# Patient Record
Sex: Male | Born: 1955 | Race: Black or African American | Hispanic: No | Marital: Married
Health system: Southern US, Community
[De-identification: ages and names within clinical notes are randomized; demographics above are authoritative.]

---

## 2024-01-11 ENCOUNTER — Emergency Department (HOSPITAL_COMMUNITY)

## 2024-01-11 ENCOUNTER — Observation Stay (HOSPITAL_BASED_OUTPATIENT_CLINIC_OR_DEPARTMENT_OTHER)

## 2024-01-11 ENCOUNTER — Other Ambulatory Visit: Payer: Self-pay

## 2024-01-11 DIAGNOSIS — I5032 Chronic diastolic (congestive) heart failure: Secondary | ICD-10-CM | POA: Diagnosis present

## 2024-01-11 DIAGNOSIS — N183 Chronic kidney disease, stage 3 unspecified: Secondary | ICD-10-CM | POA: Diagnosis present

## 2024-01-11 DIAGNOSIS — I6529 Occlusion and stenosis of unspecified carotid artery: Secondary | ICD-10-CM | POA: Diagnosis present

## 2024-01-11 DIAGNOSIS — R55 Syncope and collapse: Secondary | ICD-10-CM

## 2024-01-11 DIAGNOSIS — N400 Enlarged prostate without lower urinary tract symptoms: Secondary | ICD-10-CM | POA: Diagnosis present

## 2024-01-11 DIAGNOSIS — R1011 Right upper quadrant pain: Secondary | ICD-10-CM | POA: Diagnosis present

## 2024-01-11 DIAGNOSIS — Z86018 Personal history of other benign neoplasm: Secondary | ICD-10-CM | POA: Diagnosis present

## 2024-01-11 DIAGNOSIS — N644 Mastodynia: Secondary | ICD-10-CM | POA: Diagnosis present

## 2024-01-11 DIAGNOSIS — I2510 Atherosclerotic heart disease of native coronary artery without angina pectoris: Secondary | ICD-10-CM

## 2024-01-11 LAB — COMPREHENSIVE METABOLIC PANEL WITH GFR
ALT: 11 U/L (ref 0–44)
AST: 18 U/L (ref 15–41)
Albumin: 2.9 g/dL — ABNORMAL LOW (ref 3.5–5.0)
Alkaline Phosphatase: 56 U/L (ref 38–126)
Anion gap: 11 (ref 5–15)
BUN: 29 mg/dL — ABNORMAL HIGH (ref 8–23)
CO2: 16 mmol/L — ABNORMAL LOW (ref 22–32)
Calcium: 9.2 mg/dL (ref 8.9–10.3)
Chloride: 112 mmol/L — ABNORMAL HIGH (ref 98–111)
Creatinine, Ser: 2.25 mg/dL — ABNORMAL HIGH (ref 0.61–1.24)
GFR, Estimated: 31 mL/min — ABNORMAL LOW (ref >60)
Glucose, Bld: 114 mg/dL — ABNORMAL HIGH (ref 70–99)
Potassium: 3.7 mmol/L (ref 3.5–5.1)
Sodium: 139 mmol/L (ref 135–145)
Total Bilirubin: 0.3 mg/dL (ref 0.0–1.2)
Total Protein: 6.1 g/dL — ABNORMAL LOW (ref 6.5–8.1)

## 2024-01-11 LAB — CBC WITH DIFFERENTIAL/PLATELET
Abs Immature Granulocytes: 0.02 K/uL (ref 0.00–0.07)
Basophils Absolute: 0 K/uL (ref 0.0–0.1)
Basophils Relative: 1 %
Eosinophils Absolute: 0.2 K/uL (ref 0.0–0.5)
Eosinophils Relative: 2 %
HCT: 40.8 % (ref 39.0–52.0)
Hemoglobin: 13.1 g/dL (ref 13.0–17.0)
Immature Granulocytes: 0 %
Lymphocytes Relative: 36 %
Lymphs Abs: 2.8 K/uL (ref 0.7–4.0)
MCH: 29.7 pg (ref 26.0–34.0)
MCHC: 32.1 g/dL (ref 30.0–36.0)
MCV: 92.5 fL (ref 80.0–100.0)
Monocytes Absolute: 0.6 K/uL (ref 0.1–1.0)
Monocytes Relative: 8 %
Neutro Abs: 4.1 K/uL (ref 1.7–7.7)
Neutrophils Relative %: 53 %
Platelets: 205 K/uL (ref 150–400)
RBC: 4.41 MIL/uL (ref 4.22–5.81)
RDW: 14.6 % (ref 11.5–15.5)
WBC: 7.7 K/uL (ref 4.0–10.5)
nRBC: 0 % (ref 0.0–0.2)

## 2024-01-11 LAB — ECHOCARDIOGRAM COMPLETE
AR max vel: 3.2 cm2
AV Peak grad: 3.6 mmHg
Ao pk vel: 0.95 m/s
Area-P 1/2: 3.27 cm2
Height: 73 in
S' Lateral: 2.7 cm
Weight: 2880 [oz_av]

## 2024-01-11 LAB — BRAIN NATRIURETIC PEPTIDE: B Natriuretic Peptide: 12.8 pg/mL (ref 0.0–100.0)

## 2024-01-11 LAB — TROPONIN I (HIGH SENSITIVITY): Troponin I (High Sensitivity): 4 ng/L (ref <18)

## 2024-01-11 LAB — HIV ANTIBODY (ROUTINE TESTING W REFLEX): HIV Screen 4th Generation wRfx: NONREACTIVE

## 2024-01-11 NOTE — ED Notes (Signed)
 Received patient from previous RN, all VSS at this time, pt requesting medication for acid reflux otherwise no complaints at this time. Message sent to MD awaiting orders.

## 2024-01-11 NOTE — Progress Notes (Signed)
 Orthopedic Tech Progress Note Patient Details:  Bryan Sharp [birth year] [id number] Checked in for Level 2 trauma.  Patient ID: Bryan Sharp, male   DOB: [birth year], 68 y.o.   MRN: [id number]  Bryan Sharp 01/11/2024, 4:19 AM

## 2024-01-11 NOTE — Progress Notes (Signed)
 Pt arrived to unit from ED, admitted for syncope. Safety plan education performed, pt verbalized understanding. High fall risk protocol in place.

## 2024-01-11 NOTE — Hospital Course (Signed)
  Another MRN [id number]   68-yo m phx GERD, CAD status post CABG, CAD, chronic diastolic heart failure, CKD 3B, history of GI bleed, hyperlipidemia, essential hypertension and peripheral neuropathy presented to emergency department for fall when he was try to get up from the toilet bowl.  Patient hit his head on the bathroom floor.  EMS found patient was hypotensive blood pressure 60/90 give 700 mL of fluid blood pressure improved to upper 120s. At presentation to ED patient is hemodynamically stable. CBC unremarkable.  CMP showing low bicarb 16, 2.25 and GFR 31.  Renal function at baseline. Troponin within normal range.  Normal BNP.  CT cervical spine no evidence of fracture or dislocation. CT head no acute intracranial abnormality. Chest x-ray no active disease process.  Patient's wife reported that for last 1 week patient is not doing very well he is forgetting to medication had another presyncopal episode few days ago and today he had a syncopal episode.  In the setting of recurrence presyncope/syncope with multiple comorbidities ED physician concern to send him home right away. Last echo in 2022. Hospitalist has been consulted for further evaluation and management syncope.

## 2024-01-11 NOTE — ED Notes (Signed)
 To CT

## 2024-01-11 NOTE — ED Notes (Signed)
 Back from CT. Patient was incontinent to stool at home. Patient was cleaned. Soiled clothes and linens were removed.

## 2024-01-11 NOTE — Progress Notes (Signed)
 Echocardiogram 2D Echocardiogram has been performed.  Bryan Sharp 01/11/2024, 4:42 PM

## 2024-01-11 NOTE — ED Notes (Signed)
 CCMD called to place the patient on cardiac monitoring services.

## 2024-01-11 NOTE — ED Triage Notes (Addendum)
 EMS reports patient was using the bathroom and fell when getting up from the toilet. Patient hit his head on the bathroom floor. Takes plavix . Patient was hypotensive with first BP on scene at 60/40 and unresponsive. Patient was given 700ml of normal saline. Last pressure with EMS was 128/86. Patient arrived in C-collar on 2[facility] [place] alert and oriented x4.

## 2024-01-11 NOTE — Progress Notes (Incomplete)
..  Trauma Response Nurse Documentation   Bryan Sharp is a 68 y.o. male arriving to [facility] ED via EMS  On clopidogrel  75 mg daily. Trauma was activated as a Level 1, downgraded to Level 2 after manual BP by charge nurse based on the following trauma criteria Anytime Systolic Blood Pressure < 90.  Patient cleared for CT by Dr. Jerral. Pt transported to CT with trauma response nurse present to monitor. RN remained with the patient throughout their absence from the department for clinical observation.   GCS 15.  Trauma MD Arrival Time: ***.  History   No past medical history on file.   *** The histories are not reviewed yet. Please review them in the History navigator section and refresh this SmartLink.     Initial Focused Assessment (If applicable, or please see trauma documentation):   CT's Completed:   {Trauma CT:26866}   Interventions:   Plan for disposition:  {Trauma Dispo:26867}   Consults completed:  {Trauma Consults:26862} at ***.  Event Summary:  MTP Summary (If applicable):   Bedside handoff with {Trauma handoff:26863::ED RN} ***.    Naiomi Musto Dee  Trauma Response RN  Please call TRN at [phone] for further assistance.

## 2024-01-11 NOTE — ED Provider Notes (Signed)
 [facility] AT [facility] Provider Note   CSN: [id number] Arrival date & time: 01/11/24  0406     History Chief Complaint  Patient presents with   Fall    HPI Bryan Sharp is a 68 y.o. male presenting for syncopal event. He is a 68 year old male with a history of CAD, CHF, carotid artery stenosis.  States he was going to the bathroom when he syncopized fell and hit his head.  He was hypotensive on EMS arrival but this resolved with 700 cc IV fluid.  Patient states he feels much better. Has headache but no other acute pain. Wife arrives later and states that this is his second event of syncope this week with no prodrome.  Patient's recorded medical, surgical, social, medication list and allergies were reviewed in the Snapshot window as part of the initial history.   Review of Systems   Review of Systems  Constitutional:  Negative for chills and fever.  HENT:  Negative for ear pain and sore throat.   Eyes:  Negative for pain and visual disturbance.  Respiratory:  Negative for cough and shortness of breath.   Cardiovascular:  Negative for chest pain and palpitations.  Gastrointestinal:  Negative for abdominal pain and vomiting.  Genitourinary:  Negative for dysuria and hematuria.  Musculoskeletal:  Negative for arthralgias and back pain.  Skin:  Negative for color change and rash.  Neurological:  Positive for syncope. Negative for seizures.  All other systems reviewed and are negative.   Physical Exam Updated Vital Signs BP 116/74   Pulse 85   Temp (!) 97.3 F (36.3 C) (Oral)   Resp 16   Ht 6' 1 (1.854 m)   Wt 81.6 kg   SpO2 99%   BMI 23.75 kg/m  Physical Exam Vitals and nursing note reviewed.  Constitutional:      General: He is not in acute distress.    Appearance: He is well-developed.  HENT:     Head: Normocephalic and atraumatic.  Eyes:     Conjunctiva/sclera: Conjunctivae normal.  Cardiovascular:     Rate and Rhythm:  Normal rate and regular rhythm.     Heart sounds: No murmur heard. Pulmonary:     Effort: Pulmonary effort is normal. No respiratory distress.     Breath sounds: Normal breath sounds.  Abdominal:     Palpations: Abdomen is soft.     Tenderness: There is no abdominal tenderness.  Musculoskeletal:        General: No swelling.     Cervical back: Neck supple.  Skin:    General: Skin is warm and dry.     Capillary Refill: Capillary refill takes less than 2 seconds.  Neurological:     Mental Status: He is alert.  Psychiatric:        Mood and Affect: Mood normal.      ED Course/ Medical Decision Making/ A&P    Procedures .Critical Care  Performed by: Jerral Meth, MD Authorized by: Jerral Meth, MD   Critical care provider statement:    Critical care time (minutes):  30   Critical care was necessary to treat or prevent imminent or life-threatening deterioration of the following conditions:  Trauma   Critical care was time spent personally by me on the following activities:  Development of treatment plan with patient or surrogate, discussions with consultants, evaluation of patient's response to treatment, examination of patient, ordering and review of laboratory studies, ordering and review of radiographic studies,  ordering and performing treatments and interventions, pulse oximetry, re-evaluation of patient's condition and review of old charts    Medications Ordered in ED Medications  sodium chloride  0.9 % bolus 1,000 mL (0 mLs Intravenous Stopped 01/11/24 0532)   Medical Decision Making:   Bryan Sharp is a 68 y.o. male who presented to the ED today with a syncopal episode detailed above.    Handoff received from EMS.  Additional history discussed with patient's family/caregivers.  Patient placed on continuous vitals and telemetry monitoring while in ED which was reviewed periodically.  Complete initial physical exam performed, notably the patient  was  hemodynamically stable no acute distress.    Reviewed and confirmed nursing documentation for past medical history, family history, social history.    Initial Assessment:   With the patient's presentation of syncope, most likely diagnosis is orthostatic hypotension vs vasovagal episode. Other diagnoses were considered including (but not limited to) arrythmogenic syncope, valvular abnormality, PE, aortic dissection. These are considered less likely due to history of present illness and physical exam findings.   This is most consistent with an acute life/limb threatening illness complicated by underlying chronic conditions.   Initial Plan:  Patient additionally a level 2 trauma due to fall on thinners with head trauma. Activated as a level 2 trauma with trauma response nursing team at bedside.  CT head, CT C-spine, chest and pelvis x-ray to evaluate for acute traumatic injury Screening labs including CBC and Metabolic panel to evaluate for infectious or metabolic etiology of disease.  Urinalysis with reflex culture ordered to evaluate for UTI or relevant urologic/nephrologic pathology.  CXR to evaluate for structural/infectious intrathoracic pathology.  EKG/Troponin testing/BNP testing to evaluate for cardiac pathology. Objective evaluation as below reviewed after administration of IVF/Telemetry monitoring  Initial Study Results:   Laboratory  All laboratory results reviewed without evidence of clinically relevant pathology.     EKG EKG was reviewed independently. Rate, rhythm, axis, intervals all examined and without medically relevant abnormality. ST segments without concerns for elevations.    Radiology:  All images reviewed independently. Agree with radiology report at this time.   CT CERVICAL SPINE WO CONTRAST Result Date: 01/11/2024 EXAM: CT CERVICAL SPINE WITHOUT CONTRAST 01/11/2024 04:31:10 AM TECHNIQUE: CT of the cervical spine was performed without the administration of  intravenous contrast. Multiplanar reformatted images are provided for review. Automated exposure control, iterative reconstruction, and/or weight based adjustment of the mA/kV was utilized to reduce the radiation dose to as low as reasonably achievable. COMPARISON: CT of the cervical spine dated 04/05/2001. CLINICAL HISTORY: Polytrauma, blunt. EMS reports patient was using the bathroom and fell when getting up from the toilet. Patient hit his head on the bathroom floor. Takes plavix . Patient was hypotensive with first BP on scene at 60/40 and unresponsive. FINDINGS: CERVICAL SPINE: BONES AND ALIGNMENT: There is straightening of the normal cervical lordosis. The patient is status post interbody fusion and anterior spinal fixation at C3-4 and C4-5. There is completed fusion. DEGENERATIVE CHANGES: There is moderate chronic degenerative disc disease at C5-6 with disc space narrowing and endplate ridging causing moderate central spinal canal stenosis and moderate bilateral neural foraminal stenosis. There is also moderate bilateral neural foraminal stenosis at C4-5. SOFT TISSUES: No prevertebral soft tissue swelling. VASCULATURE: There is a stent within the right carotid artery. IMPRESSION: 1. No acute abnormality of the cervical spine related to the reported fall. 2. Status post interbody fusion and anterior spinal fixation at C3-4 and C4-5 with completed fusion. 3. Moderate  chronic degenerative disc disease at C5-6 with disc space narrowing and endplate ridging causing moderate central spinal canal stenosis and moderate bilateral neural foraminal stenosis. 4. Moderate bilateral neural foraminal stenosis at C4-5. Electronically signed by: Evalene Coho MD 01/11/2024 04:50 AM EDT RP Workstation: [id number]   DG Pelvis Portable Result Date: 01/11/2024 EXAM: 1 or 2 VIEW(S) XRAY OF THE PELVIS 01/11/2024 04:28:04 AM COMPARISON: None available. CLINICAL HISTORY: Fall. Trauma level 2 fall on thinners; patient was using  the bathroom and fell when getting up from the toilet. FINDINGS: BONES AND JOINTS: No acute fracture. No focal osseous lesion. No joint dislocation. SOFT TISSUES: The soft tissues are unremarkable. IMPRESSION: 1. No significant abnormality. Electronically signed by: Evalene Coho MD 01/11/2024 04:48 AM EDT RP Workstation: [id number]   DG Chest Portable 1 View Result Date: 01/11/2024 EXAM: 1 VIEW XRAY OF THE CHEST 01/11/2024 04:30:39 AM COMPARISON: PA and lateral radiographs of the chest dated 05/05/2023. CLINICAL HISTORY: Fall. Trauma level 2 fall on thinners; patient was using the bathroom and fell when getting up from the toilet. FINDINGS: LUNGS AND PLEURA: No focal pulmonary opacity. No pulmonary edema. No pleural effusion. No pneumothorax. HEART AND MEDIASTINUM: No acute abnormality of the cardiac and mediastinal silhouettes. The patient is status post CABG. BONES AND SOFT TISSUES: No acute osseous abnormality. IMPRESSION: 1. No acute process. Electronically signed by: Evalene Coho MD 01/11/2024 04:48 AM EDT RP Workstation: [id number]   CT HEAD WO CONTRAST ( ) Result Date: 01/11/2024 EXAM: CT HEAD WITHOUT CONTRAST 01/11/2024 04:31:10 AM TECHNIQUE: CT of the head was performed without the administration of intravenous contrast. Automated exposure control, iterative reconstruction, and/or weight based adjustment of the mA/kV was utilized to reduce the radiation dose to as low as reasonably achievable. COMPARISON: CT of the head dated 12/13/2022. CLINICAL HISTORY: Head trauma, minor (Age >= 65y). EMS reports patient was using the bathroom and fell when getting up from the toilet. Patient hit his head on the bathroom floor. Takes plavix . Patient was hypotensive with first BP on scene at 60/40 and unresponsive. FINDINGS: BRAIN AND VENTRICLES: No acute hemorrhage. Gray-white differentiation is preserved. No hydrocephalus. No extra-axial collection. No mass effect or midline shift. Moderate  periventricular and deep cerebral white matter disease. ORBITS: No acute abnormality. SINUSES: No acute abnormality. SOFT TISSUES AND SKULL: No acute soft tissue abnormality. No skull fracture. IMPRESSION: 1. No acute intracranial abnormality. 2. Moderate periventricular and deep cerebral white matter disease. Electronically signed by: Evalene Coho MD 01/11/2024 04:47 AM EDT RP Workstation: [id number]      Consults: Case discussed with hospitalist Dustin).   Final Assessment and Plan:   Patient with known prodromal syncope x 2 this week with resulting level trauma activation for head injury.  Uncertain etiology of his syncope.  Likely orthostatic as he was hypotensive.  Will treat with IV fluids and keep on telemetry today.  He has not had a cardiac evaluation in a few years per his wife. Will keep on telemetry this morning with a plan for admission to medicine for ongoing evaluation. Disposition:   Based on the above findings, I believe this patient is stable for admission.    Patient/family educated about specific findings on our evaluation and explained exact reasons for admission.  Patient/family educated about clinical situation and time was allowed to answer questions.   Admission team communicated with and agreed with need for admission. Patient admitted. Patient ready to move at this time.     Emergency Department Medication Summary:   Medications  sodium chloride  0.9 % bolus 1,000 mL (0 mLs Intravenous Stopped 01/11/24 0532)     Clinical Impression:  1. Syncope, unspecified syncope type      Admit   Final Clinical Impression(s) / ED Diagnoses Final diagnoses:  Syncope, unspecified syncope type    Rx / DC Orders ED Discharge Orders     None         Jerral Meth, MD 01/11/24 [phone]

## 2024-01-11 NOTE — H&P (Signed)
 History and Physical    Patient: Bryan Sharp FMW:[id number] DOB: 09-Aug-1955 DOA: 01/11/2024 DOS: the patient was seen and examined on 01/11/2024 PCP: Celestia Rosaline SQUIBB, NP  Patient coming from: Home  Chief Complaint:  Chief Complaint  Patient presents with   Fall   HPI: Bryan Sharp is a 68 y.o. male with medical history significant of hypertension, hyperlipidemia, CAD, diastolic congestive heart failure, carotid artery stenosis s/p revascularization in 2015, history of pheochromocytoma s/p resection in 2015, neuropathy, GI bleed, and GERD presents with recurrent syncopal episodes. He is accompanied by his sister and his wife.  He experienced two syncopal episodes, the first occurring on Sunday night around 3:00 AM and the second on the following day around 2:00 AM. Both episodes happened while he was on the commode, and he does not recall feeling dizzy or lightheaded prior to passing out. No straining during bowel movements at the time of the episodes. He has a history of a similar episode a few years ago that required hospitalization.  He has been experiencing increased bowel movements, starting on Sunday, with approximately four episodes occurring that day. He notes a change in bowel habits.  He reports right upper quadrant abdominal pain, which has been present since his heart surgery four years ago. He confirms that he still has his gallbladder.  He is currently taking gabapentin  for pain management and has been on atorvastatin, although the exact dosage is unclear.  Initial blood pressure upon EMS arrival was 60/40.  Patient was given 700 mL of IV fluids with improvement up to 128/66.  In the ED patient was noted to be afebrile with stable vital signs.  Labs significant for CO2 16, BUN 29, creatinine 2.25, and albumin 2.9 CBC, BNP, and high-sensitivity troponin within normal limits.  CT scan of the head and neck did not reveal any acute abnormality.  X-rays of the chest, pelvis,  and right knee did not show any acute abnormality.  X-ray of the left knee noted a trace joint effusion.   Review of Systems: As mentioned in the history of present illness. All other systems reviewed and are negative.       Past Medical History:  Diagnosis Date   Abnormal stress test      STRESS TEST, ABNORMAL  10-2006, no ischemia, EF 45%, DNKA for ECHO   Anxiety     Arthritis      right shoulder   Carotid artery disease (HCC)     Chronic kidney disease     Clostridium difficile diarrhea     Coronary artery disease     Dyspnea      with extraction   Gastric ulcer 11/2012   GERD (gastroesophageal reflux disease)     GI bleed 12/18/2012   High cholesterol      in the past (09/18/2013)   Hypertension     Non-compliance      h/o non compliance w/ referal and f/u    Paresthesia     Retroperitoneal mass                 Past Surgical History:  Procedure Laterality Date   ANTERIOR CERVICAL DECOMP/DISCECTOMY FUSION N/A 08/13/2020    Procedure: Cervical Three-Four, Cervical Four-Five Anterior cervical decompression/Discectomy/fusion;  Surgeon: Gillie Duncans, MD;  Location: [facility] OR;  Service: Neurosurgery;  Laterality: N/A;  Cervical Three-Four, Cervical Four-Five Anterior cervical decompression/Discectomy/fusion   CAROTID ANGIOGRAM   06/2013   CAROTID ANGIOGRAM Right 07/16/2013    Procedure: CAROTID ANGIOGRAM;  Surgeon: Redell LITTIE Door, MD;  Location: [facility] CATH LAB;  Service: Cardiovascular;  Laterality: Right;   CORONARY ARTERY BYPASS GRAFT N/A 06/04/2019    Procedure: CORONARY ARTERY BYPASS GRAFTING (CABG), ON PUMP TIMES FOUR, USING LIMA TO LAD; RIMA TO PDA; WITH ENDOSCOPICALLY HARVESTED LEFT RADIAL ARTERY SEQUENCED TO OM1 AND DIAG1.;  Surgeon: German Bartlett PEDLAR, MD;  Location: [facility] OR;  Service: Open Heart Surgery;  Laterality: N/A;  BILATERAL IMA   ESOPHAGOGASTRODUODENOSCOPY N/A 12/19/2012    Procedure: ESOPHAGOGASTRODUODENOSCOPY (EGD);  Surgeon: Norleen LOISE Kiang, MD;  Location: [facility] ENDOSCOPY;   Service: Endoscopy;  Laterality: N/A;   LAPAROSCOPIC REMOVAL ABDOMINAL MASS N/A 09/18/2013    Procedure: LAPAROSCOPIC  EXCISION RETROPERITONEAL MASS ;  Surgeon: Elspeth KYM Schultze, MD;  Location: [facility] OR;  Service: General;  Laterality: N/A;   LEFT HEART CATH AND CORONARY ANGIOGRAPHY N/A 06/01/2019    Procedure: LEFT HEART CATH AND CORONARY ANGIOGRAPHY;  Surgeon: Burnard Debby LABOR, MD;  Location: [facility] INVASIVE CV LAB;  Service: Cardiovascular;  Laterality: N/A;   RADIAL ARTERY HARVEST Left 06/04/2019    Procedure: ENDOSCOPIC RADIAL ARTERY HARVEST;  Surgeon: German Bartlett PEDLAR, MD;  Location: [facility] OR;  Service: Open Heart Surgery;  Laterality: Left;   RETROPERITONEAL MASS EXCISION   09/18/2013    laparoscopic   SHOULDER OPEN ROTATOR CUFF REPAIR Right 2006   TEE WITHOUT CARDIOVERSION N/A 06/04/2019    Procedure: TRANSESOPHAGEAL ECHOCARDIOGRAM (TEE);  Surgeon: German Bartlett PEDLAR, MD;  Location: [facility] OR;  Service: Open Heart Surgery;  Laterality: N/A;        Social history: Reports that he has never smoked. He has quit using smokeless tobacco.  His smokeless tobacco use included chew. He reports that he does not drink alcohol and does not use drugs.    Allergies  Allergen Reactions   Codeine Itching        Family History  Problem Relation Age of Onset   Hypertension Mother     Heart disease Father     Colon cancer Neg Hx     Prostate cancer Neg Hx             Prior to Admission medications   Medication Sig Start Date End Date Taking? Authorizing Provider  citalopram  (CELEXA ) 20 MG tablet Take 20 mg by mouth daily. 12/25/23  Yes [provider]  clopidogrel  (PLAVIX ) 75 MG tablet Take 75 mg by mouth daily. 01/02/24  Yes [provider]  gabapentin  (NEURONTIN ) 100 MG capsule Take 100 mg by mouth daily as needed (for nerve pain). 12/28/23  Yes [provider]  LINZESS  290 MCG CAPS capsule Take 290 mcg by mouth daily. 12/28/23  Yes [provider]  tadalafil (CIALIS) 10 MG  tablet Take 10 mg by mouth daily as needed for erectile dysfunction. 12/28/23  Yes [provider]  tamsulosin  (FLOMAX ) 0.4 MG CAPS capsule Take 0.8 mg by mouth daily. 12/15/23  Yes [provider]    Physical Exam: Vitals:   01/11/24 0445 01/11/24 0500 01/11/24 0545 01/11/24 0615  BP: 109/79 116/74 103/83 106/77  Pulse:      Resp: 16 16 10 11   Temp:      TempSrc:      SpO2:   100% 100%  Weight:      Height:       Constitutional: elderly  male in NAD, calm, comfortable Eyes: PERRL, lids and conjunctivae normal ENMT: Mucous membranes are moist. Posterior pharynx clear of any exudate or lesions.Normal dentition.  Neck: normal,  supple,  Respiratory: clear to auscultation bilaterally, no wheezing, no crackles. Normal respiratory effort. No accessory muscle use.  Cardiovascular: Regular rate and rhythm, no murmurs / rubs / gallops. No extremity edema. 2+ pedal pulses. No carotid bruits.  Abdomen: no tenderness, no masses palpated. No hepatosplenomegaly. Bowel sounds positive.  Musculoskeletal: no clubbing / cyanosis. No joint deformity upper and lower extremities. Good ROM, no contractures. Normal muscle tone.  Skin: no rashes, lesions, ulcers. No induration Neurologic: CN 2-12 grossly intact. Sensation intact, DTR normal. Strength 5/5 in all 4.  Psychiatric: Normal judgment and insight. Alert and oriented x 3. Normal mood.   Data Reviewed:  EKG reveals sinus rhythm 82 bpm with minimal ST elevations noted in the anterior leads.  Assessment and Plan:  Syncope Patient presents after having syncopal episode.  Patient reports sitting on the commode when the episode occurred.  Patient initially noted to have significantly low blood pressure.   - Admit to a telemetry bed  - Check orthostatic vital signs - Check echocardiogram - Continue IV fluids - Follow-up telemetry overnight  Chronic kidney disease stage IIIb Creatinine noted to be 2.25 with BUN 29.  Creatinine last  month had been noted to be 2.18.  Baseline previously noted to be around 1.5-1.7. - Check urinalysis - Continue gentle IV fluids - Check kidney function in a.m.  Right upper quadrant abdominal pain Patients c/o abd pain. - Check right upper quadrant ultrasound of the abdomen  Left nipple pain secondary to gynecomastia Patient just recently had ultrasound which noted small amount of asymmetrically increased breast tissue in the retroareolar of the left breast that was thought to be most consistent with benign gynecomastia.   Diastolic congestive heart failure Patient appears to be hypovolemic at this time.  Last EF noted to be 55-60% with grade 1 diastolic dysfunction back in 06/01/2019. - Monitored intake and output  CAD Patient with prior history of 4v CABG in 2021.  Last heart cath back in 06/2022   1st Diag lesion is 90% stenosed.   Mid LM to Dist LM lesion is 100% stenosed.   Prox RCA to Mid RCA lesion is 70% stenosed.   RIMA graft was visualized by angiography and is small.   LIMA graft was visualized by angiography and is small.   Seq SVG- diagonal and OM1 graft was visualized by angiography and is large.   The graft exhibits no disease.   LV end diastolic pressure is normal. - Continue Plavix   Carotid artery stenosis Patient previously underwent right transcarotid artery revascularization by Dr. Harvey in May 2015.  He followed up in vascular surgery with last evaluation noted to be in 09/2021 which was stable.  He has not had his repeat - Continue Plavix  - Continue outpatient follow-up with Dr. Harvey.   History of pheochromocytoma Noted back in 2015 s/p resection   BPH - Continue Flomax    DVT prophylaxis: Lovenox  Advance Care Planning:   Code Status: Full Code   Consults: None  Family Communication: Family updated at bedside  Severity of Illness: The appropriate patient status for this patient is OBSERVATION. Observation status is judged to be reasonable and  necessary in order to provide the required intensity of service to ensure the patient's safety. The patient's presenting symptoms, physical exam findings, and initial radiographic and laboratory data in the context of their medical condition is felt to place them at decreased risk for further clinical deterioration. Furthermore, it is anticipated that the patient will be medically stable for  discharge from the hospital within 2 midnights of admission.   Author: Maximino DELENA Sharps, MD 01/11/2024 7:36 AM  For on call review [email or web address].

## 2024-01-11 NOTE — Progress Notes (Signed)
   01/11/24 0405  Spiritual Encounters  Type of Visit Initial  Care provided to: Pt not available  Referral source Trauma page  Reason for visit Trauma  OnCall Visit No   Chaplain responded to a level one trauma downgraded to a level two. No family present.  If a chaplain is requested someone will respond.  Carley [facility]  [phone]

## 2024-01-11 NOTE — ED Notes (Signed)
 Nasal cannula removed. Patient is on room air.

## 2024-01-12 ENCOUNTER — Encounter (HOSPITAL_COMMUNITY): Payer: Self-pay | Admitting: Internal Medicine

## 2024-01-12 LAB — CBC
HCT: 38.3 % — ABNORMAL LOW (ref 39.0–52.0)
Hemoglobin: 12.6 g/dL — ABNORMAL LOW (ref 13.0–17.0)
MCH: 29.6 pg (ref 26.0–34.0)
MCHC: 32.9 g/dL (ref 30.0–36.0)
MCV: 90.1 fL (ref 80.0–100.0)
Platelets: 188 K/uL (ref 150–400)
RBC: 4.25 MIL/uL (ref 4.22–5.81)
RDW: 14.6 % (ref 11.5–15.5)
WBC: 7.1 K/uL (ref 4.0–10.5)
nRBC: 0 % (ref 0.0–0.2)

## 2024-01-12 LAB — URINALYSIS, ROUTINE W REFLEX MICROSCOPIC
Bilirubin Urine: NEGATIVE
Glucose, UA: NEGATIVE mg/dL
Hgb urine dipstick: NEGATIVE
Ketones, ur: NEGATIVE mg/dL
Leukocytes,Ua: NEGATIVE
Nitrite: NEGATIVE
Protein, ur: NEGATIVE mg/dL
Specific Gravity, Urine: 1.016 (ref 1.005–1.030)
pH: 5 (ref 5.0–8.0)

## 2024-01-12 LAB — BASIC METABOLIC PANEL WITH GFR
Anion gap: 9 (ref 5–15)
BUN: 23 mg/dL (ref 8–23)
CO2: 16 mmol/L — ABNORMAL LOW (ref 22–32)
Calcium: 8.6 mg/dL — ABNORMAL LOW (ref 8.9–10.3)
Chloride: 114 mmol/L — ABNORMAL HIGH (ref 98–111)
Creatinine, Ser: 1.7 mg/dL — ABNORMAL HIGH (ref 0.61–1.24)
GFR, Estimated: 44 mL/min — ABNORMAL LOW (ref >60)
Glucose, Bld: 95 mg/dL (ref 70–99)
Potassium: 4 mmol/L (ref 3.5–5.1)
Sodium: 139 mmol/L (ref 135–145)

## 2024-01-12 MED ORDER — SODIUM BICARBONATE 650 MG PO TABS
650.0000 mg | ORAL_TABLET | Freq: Three times a day (TID) | ORAL | 0 refills | Status: AC
Start: 2024-01-12 — End: ?

## 2024-01-12 NOTE — Evaluation (Signed)
 Occupational Therapy Evaluation Patient Details Name: Bryan Sharp MRN: [id number] DOB: June 16, 1955 Today's Date: 01/12/2024   History of Present Illness   68 y.o. male presents with recurrent syncopal episodes. PMH: hypertension, hyperlipidemia, CAD, diastolic congestive heart failure, carotid artery stenosis s/p revascularization in 2015, history of pheochromocytoma s/p resecti     Clinical Impressions PTA pt lives independently with his wife. States he has issues with fatigue since his CABG in 2021 however he has felt weaker lately with increased bowel movements/diarrhea. Currently requires min A to stand and min A with ADL tasks due to generalized weakness and feelings of dizziness.  Orthostatic BPs - nsg/MD made aware  Supine 115/86  Sitting 105/77     Standing - return to sitting;pt unable to stand entire time as he felt he was going to pass out 86/58  Return to supine 128/89    Acute OT to follow. May benefit from use of TEDs in addition to teaching pt counter pressure exercises prior to mobilizing. Recommend HHOT at DC.     If plan is discharge home, recommend the following:   A little help with walking and/or transfers;A little help with bathing/dressing/bathroom;Assistance with cooking/housework;Direct supervision/assist for medications management;Assist for transportation;Help with stairs or ramp for entrance     Functional Status Assessment   Patient has had a recent decline in their functional status and demonstrates the ability to make significant improvements in function in a reasonable and predictable amount of time.     Equipment Recommendations   BSC/3in1;Other (comment);Tub/shower bench (RW)     Recommendations for Other Services         Precautions/Restrictions   Precautions Precautions: Fall Precaution/Restrictions Comments: orthostatic; watch BP Restrictions Weight Bearing Restrictions Per Provider Order: No     Mobility Bed  Mobility Overal bed mobility: Modified Independent             General bed mobility comments: increased time    Transfers Overall transfer level: Needs assistance Equipment used: 1 person hand held assist Transfers: Sit to/from Stand Sit to Stand: Min assist                  Balance Overall balance assessment: Needs assistance Sitting-balance support: Feet supported Sitting balance-Leahy Scale: Fair       Standing balance-Leahy Scale: Poor                             ADL either performed or assessed with clinical judgement   ADL Overall ADL's : Needs assistance/impaired     Grooming: Set up;Sitting   Upper Body Bathing: Set up;Sitting   Lower Body Bathing: Minimal assistance;Sit to/from stand   Upper Body Dressing : Set up;Sitting   Lower Body Dressing: Minimal assistance;Sit to/from stand   Toilet Transfer: Minimal assistance (simulated)   Toileting- Clothing Manipulation and Hygiene: Supervision/safety;Contact guard assist       Functional mobility during ADLs: Minimal assistance;Cueing for safety;Rolling walker (2 wheels)       Vision Baseline Vision/History: 1 Wears glasses Ability to See in Adequate Light: 1 Impaired (supposed to wear glasses)       Perception         Praxis         Pertinent Vitals/Pain Pain Assessment Pain Assessment: 0-10 Pain Score: 6  Pain Location: R upper quadrant of stomach (with movement; has been having pain since open heart surgery 2021) Pain Descriptors / Indicators: Jabbing, Sharp Pain Intervention(s): Limited  activity within patient's tolerance     Extremity/Trunk Assessment Upper Extremity Assessment Upper Extremity Assessment: Overall WFL for tasks assessed   Lower Extremity Assessment Lower Extremity Assessment: Defer to PT evaluation   Cervical / Trunk Assessment Cervical / Trunk Assessment: Normal   Communication Communication Communication: No apparent difficulties    Cognition Arousal: Alert Behavior During Therapy: WFL for tasks assessed/performed Cognition: No apparent impairments             OT - Cognition Comments: slow processing; wife states he is at his baseline                 Following commands: Intact       Cueing  General Comments          Exercises     Shoulder Instructions      Home Living Family/patient expects to be discharged to:: Private residence Living Arrangements: Spouse/significant other Available Help at Discharge: Family;Available 24 hours/day Type of Home: Apartment Home Access: Level entry     Home Layout: One level     Bathroom Shower/Tub: Tub/shower unit;Curtain   Firefighter: Standard Bathroom Accessibility: Yes How Accessible: Accessible via walker Home Equipment: Shower seat          Prior Functioning/Environment Prior Level of Function : Independent/Modified Independent;Driving;History of Falls (last six months) (Associated with syncope)                    OT Problem List: Decreased strength;Decreased activity tolerance;Impaired balance (sitting and/or standing);Decreased safety awareness;Decreased knowledge of use of DME or AE;Cardiopulmonary status limiting activity   OT Treatment/Interventions: Self-care/ADL training;Therapeutic exercise;Energy conservation;DME and/or AE instruction;Therapeutic activities;Patient/family education;Balance training      OT Goals(Current goals can be found in the care plan section)   Acute Rehab OT Goals Patient Stated Goal: feel better OT Goal Formulation: With patient/family Time For Goal Achievement: 01/26/24 Potential to Achieve Goals: Good   OT Frequency:  Min 2X/week    Co-evaluation              AM-PAC OT 6 Clicks Daily Activity     Outcome Measure Help from another person eating meals?: None Help from another person taking care of personal grooming?: A Little Help from another person toileting, which  includes using toliet, bedpan, or urinal?: A Little Help from another person bathing (including washing, rinsing, drying)?: A Little Help from another person to put on and taking off regular upper body clothing?: A Little Help from another person to put on and taking off regular lower body clothing?: A Little 6 Click Score: 19   End of Session Equipment Utilized During Treatment: Gait belt;Rolling walker (2 wheels) Nurse Communication: Mobility status;Other (comment) (orthostatic)  Activity Tolerance: Treatment limited secondary to medical complications (Comment) (orthostatic) Patient left: in bed;with call bell/phone within reach;with bed alarm set;with family/visitor present  OT Visit Diagnosis: Unsteadiness on feet (R26.81);Other abnormalities of gait and mobility (R26.89);Muscle weakness (generalized) (M62.81);Pain Pain - Right/Left: Right Pain - part of body:  (upper quadrant of stomach)                Time: 9054-8984 OT Time Calculation (min): 30 min Charges:  OT General Charges $OT Visit: 1 Visit OT Evaluation $OT Eval Low Complexity: 1 Low OT Treatments $Self Care/Home Management : 8-22 mins  Kreg Sink, OT/L   Acute OT Clinical Specialist Acute Rehabilitation Services Pager [phone] Office [phone]   [facility] 01/12/2024, 11:04 AM

## 2024-01-12 NOTE — Care Management Obs Status (Signed)
 MEDICARE OBSERVATION STATUS NOTIFICATION   Patient Details  Name: Bryan Sharp MRN: [id number] Date of Birth: [birth year]   Medicare Observation Status Notification Given:  Yes  Obs letter signed and copy given  [name] 01/12/2024, 12:28 PM

## 2024-01-12 NOTE — Care Management Obs Status (Signed)
 MEDICARE OBSERVATION STATUS NOTIFICATION   Patient Details  Name: Bryan Sharp MRN: [id number] Date of Birth: 05/19/1956   Medicare Observation Status Notification Given:  Yes Obs letter signed and copy given   [name] 01/12/2024, 11:46 AM

## 2024-01-12 NOTE — Discharge Summary (Signed)
 Physician Discharge Summary  Bryan Sharp FMW:[id number] DOB: 08-Jun-1955 DOA: 01/11/2024  PCP: Celestia Rosaline SQUIBB, NP  Admit date: 01/11/2024 Discharge date: 01/12/2024  Admitted From: Home Disposition: Home  Recommendations for Outpatient Follow-up:  Follow up with PCP in 1 week with repeat CBC/BMP Outpatient evaluation and follow-up by GI if abdominal pain symptoms persist Outpatient evaluation and follow-up by cardiology Follow up in ED if symptoms worsen or new appear   Home Health: No Equipment/Devices: None  Discharge Condition: Stable CODE STATUS: Full Diet recommendation: Heart healthy  Brief/Interim Summary:  68 y.o. male with medical history significant of hypertension, hyperlipidemia, CAD, diastolic congestive heart failure, carotid artery stenosis s/p revascularization in 2015, history of pheochromocytoma s/p resection in 2015, neuropathy, GI bleed, and GERD presented with recurrent syncopal episodes along with intermittent abdominal pain.  On presentation, he was hypotensive.  Blood pressure improved with IV fluids given by EMS.  On presentation, he had stable vital signs with creatinine of 2.25, CBC, BMP, high-sensitivity troponins were within normal limits.  CT scan of the head and neck did not reveal any acute abnormality.  X-rays of the chest, pelvis and right knee did not show any acute abnormality.  X-ray of the left knee is noted a trace left pleural effusion.  During the hospitalization, he has not had any more syncopal episodes.  2D echo showed EF of 60 to 65% with grade 1 diastolic dysfunction.  He feels better.  He feels okay to go home today.  He will be discharged home today after PT eval.  Discharge Diagnoses:   Syncope -Questional cause.  Patient reports sitting on the commode when the episode occurred.  Possibly vasovagal.  No more syncopal episodes since admission.  Telemetry monitoring was unremarkable.  2D echo as above.  He feels better.  He will be  discharged home today after PT eval.  Recommend outpatient follow-up evaluation and follow-up by cardiology.  Chronically disease stage IIIb -Creatinine currently at baseline.  Outpatient follow-up.  Received gentle hydration on admission  Acute metabolic acidosis - Still acidotic.  Start oral sodium bicarbonate  tablets and discharged on the same.  Encourage oral intake.  Outpatient follow-up of BMP within a week by PCP  Right upper quadrant abdominal pain -Improving.  Right upper quadrant ultrasound showed no acute findings.  Empirically treat with Protonix  and as needed Maalox.  Outpatient evaluation and follow-up with GI if symptoms persist  Chronic diastolic CHF CAD status post CABG -Currently compensated.  Continue diet and fluid restriction.  Outpatient follow-up by cardiology - Currently chest pain-free.  Continue Plavix   BPH -continue Flomax   History of pheochromocytoma - Noted back in 2015 s/p resection  Carotid artery stenosis - Status post right transcarotid artery revascularization by Dr. Delmar in May 2015.  Outpatient follow-up with vascular surgery.  Continue Plavix .   Discharge Instructions  Discharge Instructions     Diet - low sodium heart healthy   Complete by: As directed    Increase activity slowly   Complete by: As directed       Allergies as of 01/12/2024       Reactions   Codeine Itching        Medication List     TAKE these medications    alum & mag hydroxide-simeth 200-200-20 MG/5ML suspension Commonly known as: MAALOX/MYLANTA Take 15 mLs by mouth every 6 (six) hours as needed for indigestion or heartburn.   citalopram  20 MG tablet Commonly known as: CELEXA  Take 20 mg by mouth daily.  clopidogrel  75 MG tablet Commonly known as: PLAVIX  Take 75 mg by mouth daily.   gabapentin  100 MG capsule Commonly known as: NEURONTIN  Take 100 mg by mouth daily as needed (for nerve pain).   Linzess  290 MCG Caps capsule Generic drug:  linaclotide  Take 290 mcg by mouth daily.   pantoprazole  40 MG tablet Commonly known as: Protonix  Take 1 tablet (40 mg total) by mouth daily.   sodium bicarbonate  650 MG tablet Take 1 tablet (650 mg total) by mouth 3 (three) times daily.   tadalafil 10 MG tablet Commonly known as: CIALIS Take 10 mg by mouth daily as needed for erectile dysfunction.   tamsulosin  0.4 MG Caps capsule Commonly known as: FLOMAX  Take 0.8 mg by mouth daily.        Follow-up Information     Celestia Rosaline SQUIBB, NP. Schedule an appointment as soon as possible for a visit in 1 week(s).   Specialty: Internal Medicine Why: With repeat CBC/BMP Contact information: [address] [place] [address] [phone]                Allergies  Allergen Reactions   Codeine Itching    Consultations: None   Procedures/Studies: US  Abdomen Limited RUQ (LIVER/GB) Result Date: 01/11/2024 CLINICAL DATA:  Right upper quadrant pain EXAM: ULTRASOUND ABDOMEN LIMITED RIGHT UPPER QUADRANT COMPARISON:  None Available. FINDINGS: Gallbladder: No gallstones or wall thickening visualized. No sonographic Murphy sign noted by sonographer. Common bile duct: Diameter: Normal caliber, 2 mm Liver: No focal lesion identified. Within normal limits in parenchymal echogenicity. Portal vein is patent on color Doppler imaging with normal direction of blood flow towards the liver. Other: None. IMPRESSION: No acute findings. Electronically Signed   By: Franky Crease M.D.   On: 01/11/2024 19:23   ECHOCARDIOGRAM COMPLETE Result Date: 01/11/2024    ECHOCARDIOGRAM REPORT   Patient Name:   Bryan Sharp Date of Exam: 01/11/2024 Medical Rec #:  [id number]       Height:       73.0 in Accession #:    [id number]      Weight:       180.0 lb Date of Birth:  06-Nov-1955        BSA:          2.057 m Patient Age:    68 years        BP:           99/73 mmHg Patient Gender: M               HR:           78 bpm. Exam Location:  Inpatient  Procedure: 2D Echo, Cardiac Doppler, Color Doppler and Intracardiac            Opacification Agent (Both Spectral and Color Flow Doppler were            utilized during procedure). Indications:    Syncope R55  History:        Patient has no prior history of Echocardiogram examinations.                 Signs/Symptoms:Syncope.  Sonographer:    Thea Norlander RCS Referring Phys: [phone] RONDELL A SMITH IMPRESSIONS  1. Left ventricular ejection fraction, by estimation, is 60 to 65%. The left ventricle has normal function. The left ventricle has no regional wall motion abnormalities. Left ventricular diastolic parameters are consistent with Grade I diastolic dysfunction (impaired relaxation).  2. Right ventricular  systolic function is normal. The right ventricular size is normal.  3. The mitral valve is normal in structure. No evidence of mitral valve regurgitation. No evidence of mitral stenosis.  4. The aortic valve is tricuspid. Aortic valve regurgitation is not visualized. No aortic stenosis is present. FINDINGS  Left Ventricle: Left ventricular ejection fraction, by estimation, is 60 to 65%. The left ventricle has normal function. The left ventricle has no regional wall motion abnormalities. Definity  contrast agent was given IV to delineate the left ventricular  endocardial borders. The left ventricular internal cavity size was normal in size. There is no left ventricular hypertrophy. Left ventricular diastolic parameters are consistent with Grade I diastolic dysfunction (impaired relaxation). Right Ventricle: The right ventricular size is normal. No increase in right ventricular wall thickness. Right ventricular systolic function is normal. Left Atrium: Left atrial size was normal in size. Right Atrium: Right atrial size was normal in size. Pericardium: Trivial pericardial effusion is present. The pericardial effusion is localized near the right atrium. Mitral Valve: The mitral valve is normal in structure. No  evidence of mitral valve regurgitation. No evidence of mitral valve stenosis. Tricuspid Valve: The tricuspid valve is normal in structure. Tricuspid valve regurgitation is mild . No evidence of tricuspid stenosis. Aortic Valve: The aortic valve is tricuspid. Aortic valve regurgitation is not visualized. No aortic stenosis is present. Aortic valve peak gradient measures 3.6 mmHg. Pulmonic Valve: The pulmonic valve was normal in structure. Pulmonic valve regurgitation is trivial. No evidence of pulmonic stenosis. Aorta: The aortic root is normal in size and structure. Venous: The inferior vena cava was not well visualized. IAS/Shunts: No atrial level shunt detected by color flow Doppler.  LEFT VENTRICLE PLAX 2D LVIDd:         4.00 cm   Diastology LVIDs:         2.70 cm   LV e' medial:    8.86 cm/s LV PW:         1.10 cm   LV E/e' medial:  6.5 LV IVS:        0.80 cm   LV e' lateral:   10.90 cm/s LVOT diam:     2.20 cm   LV E/e' lateral: 5.3 LV SV:         45 LV SV Index:   22 LVOT Area:     3.80 cm  RIGHT VENTRICLE RV S prime:     12.10 cm/s TAPSE (M-mode): 1.9 cm LEFT ATRIUM             Index        RIGHT ATRIUM           Index LA diam:        3.00 cm 1.46 cm/m   RA Area:     19.00 cm LA Vol (A2C):   21.2 ml 10.30 ml/m  RA Volume:   52.50 ml  25.52 ml/m LA Vol (A4C):   21.5 ml 10.45 ml/m LA Biplane Vol: 22.4 ml 10.89 ml/m  AORTIC VALVE AV Area (Vmax): 3.20 cm AV Vmax:        95.00 cm/s AV Peak Grad:   3.6 mmHg LVOT Vmax:      79.90 cm/s LVOT Vmean:     49.400 cm/s LVOT VTI:       0.118 m  AORTA Ao Root diam: 3.70 cm Ao Asc diam:  3.40 cm MITRAL VALVE               TRICUSPID VALVE  MV Area (PHT): 3.27 cm    TR Peak grad:   19.4 mmHg MV Decel Time: 232 msec    TR Vmax:        220.00 cm/s MV E velocity: 57.50 cm/s MV A velocity: 66.10 cm/s  SHUNTS MV E/A ratio:  0.87        Systemic VTI:  0.12 m                            Systemic Diam: 2.20 cm Toribio Fuel MD Electronically signed by Toribio Fuel MD  Signature Date/Time: 01/11/2024/5:59:03 PM    Final    DG Knee Right Port Result Date: 01/11/2024 CLINICAL DATA:  68 year old male with syncope and fall. On blood thinners. EXAM: PORTABLE RIGHT KNEE - 1-2 VIEW COMPARISON:  None Available. FINDINGS: Two views at 0713 hours. Bone mineralization is within normal limits. Mild medial compartment joint space loss and degenerative spurring. Maintained alignment. Patella intact. No joint effusion identified on cross-table lateral. No acute osseous abnormality identified. No superficial soft tissue injury identified. IMPRESSION: No acute fracture or dislocation identified about the right knee. Electronically Signed   By: VEAR Hurst M.D.   On: 01/11/2024 07:33   DG Knee Left Port Result Date: 01/11/2024 CLINICAL DATA:  68 year old male with syncope and fall. On blood thinners. EXAM: PORTABLE LEFT KNEE - 1-2 VIEW COMPARISON:  None Available. FINDINGS: Two views. Bone mineralization is within normal limits. Maintained joint spaces and alignment. Mild for age medial compartment predominant degenerative spurring. Patella intact. Trace suprapatellar joint effusion on cross-table lateral. No acute osseous abnormality identified. No superficial soft tissue injury. IMPRESSION: Trace joint effusion. No acute fracture or dislocation identified about the left knee. Electronically Signed   By: VEAR Hurst M.D.   On: 01/11/2024 07:32   CT CERVICAL SPINE WO CONTRAST Result Date: 01/11/2024 EXAM: CT CERVICAL SPINE WITHOUT CONTRAST 01/11/2024 04:31:10 AM TECHNIQUE: CT of the cervical spine was performed without the administration of intravenous contrast. Multiplanar reformatted images are provided for review. Automated exposure control, iterative reconstruction, and/or weight based adjustment of the mA/kV was utilized to reduce the radiation dose to as low as reasonably achievable. COMPARISON: CT of the cervical spine dated 04/05/2001. CLINICAL HISTORY: Polytrauma, blunt. EMS reports  patient was using the bathroom and fell when getting up from the toilet. Patient hit his head on the bathroom floor. Takes plavix . Patient was hypotensive with first BP on scene at 60/40 and unresponsive. FINDINGS: CERVICAL SPINE: BONES AND ALIGNMENT: There is straightening of the normal cervical lordosis. The patient is status post interbody fusion and anterior spinal fixation at C3-4 and C4-5. There is completed fusion. DEGENERATIVE CHANGES: There is moderate chronic degenerative disc disease at C5-6 with disc space narrowing and endplate ridging causing moderate central spinal canal stenosis and moderate bilateral neural foraminal stenosis. There is also moderate bilateral neural foraminal stenosis at C4-5. SOFT TISSUES: No prevertebral soft tissue swelling. VASCULATURE: There is a stent within the right carotid artery. IMPRESSION: 1. No acute abnormality of the cervical spine related to the reported fall. 2. Status post interbody fusion and anterior spinal fixation at C3-4 and C4-5 with completed fusion. 3. Moderate chronic degenerative disc disease at C5-6 with disc space narrowing and endplate ridging causing moderate central spinal canal stenosis and moderate bilateral neural foraminal stenosis. 4. Moderate bilateral neural foraminal stenosis at C4-5. Electronically signed by: Evalene Coho MD 01/11/2024 04:50 AM EDT RP Workstation: [id number]   DG  Pelvis Portable Result Date: 01/11/2024 EXAM: 1 or 2 VIEW(S) XRAY OF THE PELVIS 01/11/2024 04:28:04 AM COMPARISON: None available. CLINICAL HISTORY: Fall. Trauma level 2 fall on thinners; patient was using the bathroom and fell when getting up from the toilet. FINDINGS: BONES AND JOINTS: No acute fracture. No focal osseous lesion. No joint dislocation. SOFT TISSUES: The soft tissues are unremarkable. IMPRESSION: 1. No significant abnormality. Electronically signed by: Evalene Coho MD 01/11/2024 04:48 AM EDT RP Workstation: [id number]   DG Chest Portable  1 View Result Date: 01/11/2024 EXAM: 1 VIEW XRAY OF THE CHEST 01/11/2024 04:30:39 AM COMPARISON: PA and lateral radiographs of the chest dated 05/05/2023. CLINICAL HISTORY: Fall. Trauma level 2 fall on thinners; patient was using the bathroom and fell when getting up from the toilet. FINDINGS: LUNGS AND PLEURA: No focal pulmonary opacity. No pulmonary edema. No pleural effusion. No pneumothorax. HEART AND MEDIASTINUM: No acute abnormality of the cardiac and mediastinal silhouettes. The patient is status post CABG. BONES AND SOFT TISSUES: No acute osseous abnormality. IMPRESSION: 1. No acute process. Electronically signed by: Evalene Coho MD 01/11/2024 04:48 AM EDT RP Workstation: [id number]   CT HEAD WO CONTRAST ( ) Result Date: 01/11/2024 EXAM: CT HEAD WITHOUT CONTRAST 01/11/2024 04:31:10 AM TECHNIQUE: CT of the head was performed without the administration of intravenous contrast. Automated exposure control, iterative reconstruction, and/or weight based adjustment of the mA/kV was utilized to reduce the radiation dose to as low as reasonably achievable. COMPARISON: CT of the head dated 12/13/2022. CLINICAL HISTORY: Head trauma, minor (Age >= 65y). EMS reports patient was using the bathroom and fell when getting up from the toilet. Patient hit his head on the bathroom floor. Takes plavix . Patient was hypotensive with first BP on scene at 60/40 and unresponsive. FINDINGS: BRAIN AND VENTRICLES: No acute hemorrhage. Gray-white differentiation is preserved. No hydrocephalus. No extra-axial collection. No mass effect or midline shift. Moderate periventricular and deep cerebral white matter disease. ORBITS: No acute abnormality. SINUSES: No acute abnormality. SOFT TISSUES AND SKULL: No acute soft tissue abnormality. No skull fracture. IMPRESSION: 1. No acute intracranial abnormality. 2. Moderate periventricular and deep cerebral white matter disease. Electronically signed by: Evalene Coho MD 01/11/2024  04:47 AM EDT RP Workstation: [id number]      Subjective: Patient seen and examined at bedside.  Still having intermittent abdominal pain but feels better.  Feels okay to go home today.  No fever, vomiting, chest pain or worsening shortness of breath reported.  Discharge Exam: Vitals:   01/12/24 0059 01/12/24 0812  BP: 108/79 111/81  Pulse: 89 92  Resp:  18  Temp: 98.3 F (36.8 C) 98.5 F (36.9 C)  SpO2: 99% 98%    General: Pt is alert, awake, not in acute distress.  Looks chronically ill and deconditioned.  On room air.  Slow to respond. Cardiovascular: rate controlled, S1/S2 + Respiratory: bilateral decreased breath sounds at bases Abdominal: Soft, NT, ND, bowel sounds + Extremities: no edema, no cyanosis    The results of significant diagnostics from this hospitalization (including imaging, microbiology, ancillary and laboratory) are listed below for reference.     Microbiology: No results found for this or any previous visit (from the past 240 hours).   Labs: BNP (last 3 results) Recent Labs    01/11/24 0413  BNP 12.8   Basic Metabolic Panel: Recent Labs  Lab 01/11/24 0413 01/12/24 0245  NA 139 139  K 3.7 4.0  CL 112* 114*  CO2 16* 16*  GLUCOSE 114* 95  BUN  29* 23  CREATININE 2.25* 1.70*  CALCIUM 9.2 8.6*   Liver Function Tests: Recent Labs  Lab 01/11/24 0413  AST 18  ALT 11  ALKPHOS 56  BILITOT 0.3  PROT 6.1*  ALBUMIN 2.9*   No results for input(s): LIPASE, AMYLASE in the last 168 hours. No results for input(s): AMMONIA in the last 168 hours. CBC: Recent Labs  Lab 01/11/24 0413 01/12/24 0245  WBC 7.7 7.1  NEUTROABS 4.1  --   HGB 13.1 12.6*  HCT 40.8 38.3*  MCV 92.5 90.1  PLT 205 188   Cardiac Enzymes: No results for input(s): CKTOTAL, CKMB, CKMBINDEX, TROPONINI in the last 168 hours. BNP: Invalid input(s): POCBNP CBG: No results for input(s): GLUCAP in the last 168 hours. D-Dimer No results for input(s):  DDIMER in the last 72 hours. Hgb A1c No results for input(s): HGBA1C in the last 72 hours. Lipid Profile No results for input(s): CHOL, HDL, LDLCALC, TRIG, CHOLHDL, LDLDIRECT in the last 72 hours. Thyroid function studies No results for input(s): TSH, T4TOTAL, T3FREE, THYROIDAB in the last 72 hours.  Invalid input(s): FREET3 Anemia work up No results for input(s): VITAMINB12, FOLATE, FERRITIN, TIBC, IRON, RETICCTPCT in the last 72 hours. Urinalysis No results found for: COLORURINE, APPEARANCEUR, LABSPEC, PHURINE, GLUCOSEU, HGBUR, BILIRUBINUR, KETONESUR, PROTEINUR, UROBILINOGEN, NITRITE, LEUKOCYTESUR Sepsis Labs Recent Labs  Lab 01/11/24 0413 01/12/24 0245  WBC 7.7 7.1   Microbiology No results found for this or any previous visit (from the past 240 hours).   Time coordinating discharge: 35 minutes  SIGNED:   Sophie Mao, MD  [facility] 01/12/2024, 10:04 AM

## 2024-01-12 NOTE — Plan of Care (Signed)
 Patient calm and cooperative A&O X4, significant other at bedside. Patient asked to microshift to maintain turn tolerance. Patient educated on care plan and vital signs. Patient left with call bell in reach, bed in lowest position and side rails up.  Problem: Education: Goal: Knowledge of General Education information will improve Description: Including pain rating scale, medication(s)/side effects and non-pharmacologic comfort measures Outcome: Progressing   Problem: Health Behavior/Discharge Planning: Goal: Ability to manage health-related needs will improve Outcome: Progressing   Problem: Clinical Measurements: Goal: Ability to maintain clinical measurements within normal limits will improve Outcome: Progressing   Problem: Activity: Goal: Risk for activity intolerance will decrease Outcome: Progressing   Problem: Nutrition: Goal: Adequate nutrition will be maintained Outcome: Progressing   Problem: Coping: Goal: Level of anxiety will decrease Outcome: Progressing   Problem: Pain Managment: Goal: General experience of comfort will improve and/or be controlled Outcome: Progressing   Problem: Safety: Goal: Ability to remain free from injury will improve Outcome: Progressing

## 2024-01-12 NOTE — Progress Notes (Addendum)
 Transition of Care [facility]) - Inpatient Brief Assessment   Patient Details  Name: Bryan Sharp MRN: [id number] Date of Birth: 12-11-1955  [facility] CM/SW Contact:    Rosaline JONELLE Joe, RN Phone Number: 01/12/2024, 10:10 AM   Clinical Narrative: Patient admitted for syncope.  No IP Care management needs at this time.  01/12/24 1241 - Patient was seen by PT/OT and home health services are recommended.  Patient was dizzy with standing and TED hose was ordered.  MD discontinued discharge order for today.  CM spoke with the patient and wife and offered Medicare choice regarding DME agency for RW and home health agency and wife did not have a preference.  I called [employer] and requested delivery of RW to the hospital room today.  DME order placed to be co-signed by MD.  [facility] orders are in for PT/OT.  I called [facility] and [place], RNCM accepted for services.  Patient will likely discharge home with the wife when stable - likely tomorrow by car.   Transition of Care Asessment: Insurance and Status: (P) Insurance coverage has been reviewed Patient has primary care physician: (P) Yes Home environment has been reviewed: (P) from home with spouse Prior level of function:: (P) self Prior/Current Home Services: (P) No current home services Social Drivers of Health Review: (P) SDOH reviewed no interventions necessary Readmission risk has been reviewed: (P) Yes Transition of care needs: (P) no transition of care needs at this time

## 2024-01-12 NOTE — Evaluation (Addendum)
 Physical Therapy Evaluation Patient Details Name: Bryan Sharp MRN: [id number] DOB: 02/13/1956 Today's Date: 01/12/2024  History of Present Illness  68 y.o. male presents with recurrent syncopal episodes. PMH: hypertension, hyperlipidemia, CAD, diastolic congestive heart failure, carotid artery stenosis s/p revascularization in 2015, history of pheochromocytoma s/p resecti  Clinical Impression  Pt in bed upon arrival with wife present and agreeable to PT eval. PTA, pt was independent for mobility with no AD. Pt reports increased weakness, balance issues, and fatigue that has been worse over the past week. In today's session, pt was CGA for bed mobility and MinA for sit<>stand transition with RW. Pt was slightly lightheaded with transition into sitting with a few LE performed prior to standing. Pt started to sway posteriorly with decreased responsiveness to questions. MinA to return to sitting as pt began to slowly descend back to the edge of bed. Later relayed feeling hot with decreased vision. Pt has 24/7 level of assist available at home. Recommending post-acute HHPT to work towards independence with mobility. Anticipating pt will improve well with continued mobility and resolution of symptoms. Pt would benefit from acute skilled PT with current functional limitations listed below (see PT Problem List). Acute PT to follow.    01/12/24 1123  Orthostatic Lying   BP- Lying 123/81  Pulse- Lying 84  Orthostatic Sitting  BP- Sitting 100/80  Pulse- Sitting 88  Orthostatic Standing at 0 minutes  BP- Standing at 0 minutes 92/72  Pulse- Standing at 0 minutes 90  Orthostatic Standing at 3 minutes  Pulse- Standing at 3 minutes  (unable to stand x3 min)           If plan is discharge home, recommend the following: A lot of help with walking and/or transfers;A lot of help with bathing/dressing/bathroom;Assist for transportation;Help with stairs or ramp for entrance;Assistance with cooking/housework      Equipment Recommendations Other (comment) (TBD- RW)     Functional Status Assessment Patient has had a recent decline in their functional status and demonstrates the ability to make significant improvements in function in a reasonable and predictable amount of time.     Precautions / Restrictions Precautions Precautions: Fall Precaution/Restrictions Comments: orthostatic; watch BP Restrictions Weight Bearing Restrictions Per Provider Order: No      Mobility  Bed Mobility Overal bed mobility: Modified Independent    General bed mobility comments: increased time    Transfers Overall transfer level: Needs assistance Equipment used: Rolling walker (2 wheels) Transfers: Sit to/from Stand Sit to Stand: Min assist    General transfer comment: No physical assist to rise with cues for hand placement with RW. MinA to assist with lowering to EOB as pt started to lose consciousness with decreased responsiveness    Ambulation/Gait    General Gait Details: unable this date     Balance Overall balance assessment: Needs assistance Sitting-balance support: Feet supported Sitting balance-Leahy Scale: Fair   Postural control: Posterior lean Standing balance support: Bilateral upper extremity supported, During functional activity, Reliant on assistive device for balance Standing balance-Leahy Scale: Poor Standing balance comment: reliant on external and UE support, orthostatic         Pertinent Vitals/Pain Pain Assessment Pain Assessment: Faces Faces Pain Scale: Hurts even more Pain Location: R upper quadrant of stomach (with movement; has been having pain since open heart surgery 2021) Pain Descriptors / Indicators: Jabbing, Sharp Pain Intervention(s): Limited activity within patient's tolerance, Monitored during session, Repositioned    Home Living Family/patient expects to be discharged to::  Private residence Living Arrangements: Spouse/significant other Available Help  at Discharge: Family;Available 24 hours/day Type of Home: Apartment Home Access: Level entry       Home Layout: One level Home Equipment: Shower seat      Prior Function Prior Level of Function : Independent/Modified Independent;Driving;History of Falls (last six months) (Associated with syncope)        Extremity/Trunk Assessment   Upper Extremity Assessment Upper Extremity Assessment: Defer to OT evaluation    Lower Extremity Assessment Lower Extremity Assessment: Generalized weakness    Cervical / Trunk Assessment Cervical / Trunk Assessment: Normal  Communication   Communication Communication: No apparent difficulties    Cognition Arousal: Alert Behavior During Therapy: WFL for tasks assessed/performed   PT - Cognitive impairments: No apparent impairments    Following commands: Intact       Cueing Cueing Techniques: Verbal cues     General Comments General comments (skin integrity, edema, etc.): Family present and supportive throughout session    Exercises General Exercises - Lower Extremity Long Arc Quad: AROM, Both, 10 reps, Seated Hip Flexion/Marching: AROM, Both, 10 reps, Seated   Assessment/Plan    PT Assessment Patient needs continued PT services  PT Problem List Decreased strength;Decreased activity tolerance;Decreased balance;Decreased mobility       PT Treatment Interventions DME instruction;Gait training;Functional mobility training;Therapeutic activities;Therapeutic exercise;Neuromuscular re-education;Balance training;Patient/family education    PT Goals (Current goals can be found in the Care Plan section)  Acute Rehab PT Goals Patient Stated Goal: to feel better PT Goal Formulation: With patient Time For Goal Achievement: 01/26/24 Potential to Achieve Goals: Good    Frequency Min 2X/week        AM-PAC PT 6 Clicks Mobility  Outcome Measure Help needed turning from your back to your side while in a flat bed without using  bedrails?: A Little Help needed moving from lying on your back to sitting on the side of a flat bed without using bedrails?: A Little Help needed moving to and from a bed to a chair (including a wheelchair)?: A Little Help needed standing up from a chair using your arms (e.g., wheelchair or bedside chair)?: A Little Help needed to walk in hospital room?: Total Help needed climbing 3-5 steps with a railing? : Total 6 Click Score: 14    End of Session Equipment Utilized During Treatment: Gait belt Activity Tolerance: Other (comment) (orthostatic) Patient left: in bed;with call bell/phone within reach;with bed alarm set;with family/visitor present Nurse Communication: Mobility status PT Visit Diagnosis: Unsteadiness on feet (R26.81);Other abnormalities of gait and mobility (R26.89);Muscle weakness (generalized) (M62.81);History of falling (Z91.81)    Time: 8954-8894 PT Time Calculation (min) (ACUTE ONLY): 20 min   Charges:   PT Evaluation $PT Eval Low Complexity: 1 Low   PT General Charges $$ ACUTE PT VISIT: 1 Visit    Kate ORN, PT, DPT Secure Chat Preferred  Rehab Office [phone]   Kate BRAVO Wendolyn 01/12/2024, 11:29 AM
